# Patient Record
Sex: Male | Born: 1950 | Marital: Single
Health system: Southern US, Community
[De-identification: ages and names within clinical notes are randomized; demographics above are authoritative.]

---

## 2011-04-08 DEATH — deceased

## 2016-01-11 ENCOUNTER — Inpatient Hospital Stay
Admission: AD | Admit: 2016-01-11 | Discharge: 2016-01-28 | Payer: Medicare Other | Source: Ambulatory Visit | Attending: Internal Medicine | Admitting: Internal Medicine

## 2016-01-11 DIAGNOSIS — T85528A Displacement of other gastrointestinal prosthetic devices, implants and grafts, initial encounter: Secondary | ICD-10-CM

## 2016-01-11 DIAGNOSIS — J9690 Respiratory failure, unspecified, unspecified whether with hypoxia or hypercapnia: Secondary | ICD-10-CM

## 2016-01-11 DIAGNOSIS — K650 Generalized (acute) peritonitis: Secondary | ICD-10-CM

## 2016-01-11 DIAGNOSIS — J9600 Acute respiratory failure, unspecified whether with hypoxia or hypercapnia: Secondary | ICD-10-CM

## 2016-01-11 DIAGNOSIS — L0291 Cutaneous abscess, unspecified: Secondary | ICD-10-CM

## 2016-01-11 DIAGNOSIS — R509 Fever, unspecified: Secondary | ICD-10-CM

## 2016-01-11 DIAGNOSIS — K668 Other specified disorders of peritoneum: Secondary | ICD-10-CM

## 2016-01-11 LAB — BLOOD GAS, ARTERIAL
Acid-Base Excess: 1.6 mmol/L (ref 0.0–2.0)
BICARBONATE: 24.7 meq/L — AB (ref 20.0–24.0)
FIO2: 0.3
LHR: 16 {breaths}/min
MECHVT: 500 mL
O2 Saturation: 92.6 %
PATIENT TEMPERATURE: 98.6
PEEP: 5 cmH2O
PO2 ART: 63.4 mmHg — AB (ref 80.0–100.0)
TCO2: 25.7 mmol/L (ref 0–100)
pCO2 arterial: 32.7 mmHg — ABNORMAL LOW (ref 35.0–45.0)
pH, Arterial: 7.491 — ABNORMAL HIGH (ref 7.350–7.450)

## 2016-01-11 LAB — VANCOMYCIN, TROUGH

## 2016-01-12 ENCOUNTER — Other Ambulatory Visit (HOSPITAL_COMMUNITY): Payer: Medicare Other

## 2016-01-12 ENCOUNTER — Encounter: Payer: Self-pay | Admitting: General Surgery

## 2016-01-12 LAB — COMPREHENSIVE METABOLIC PANEL
ALBUMIN: 1.8 g/dL — AB (ref 3.5–5.0)
ALK PHOS: 91 U/L (ref 38–126)
ALT: 20 U/L (ref 17–63)
ALT: 17 U/L (ref 17–63)
ANION GAP: 8 (ref 5–15)
ANION GAP: 6 (ref 5–15)
AST: 24 U/L (ref 15–41)
AST: 21 U/L (ref 15–41)
Albumin: 1.9 g/dL — ABNORMAL LOW (ref 3.5–5.0)
Alkaline Phosphatase: 84 U/L (ref 38–126)
BILIRUBIN TOTAL: 0.6 mg/dL (ref 0.3–1.2)
BUN: 9 mg/dL (ref 6–20)
BUN: 5 mg/dL — ABNORMAL LOW (ref 6–20)
CALCIUM: 8.6 mg/dL — AB (ref 8.9–10.3)
CHLORIDE: 102 mmol/L (ref 101–111)
CO2: 26 mmol/L (ref 22–32)
CO2: 26 mmol/L (ref 22–32)
CREATININE: 0.49 mg/dL — AB (ref 0.61–1.24)
Calcium: 8.5 mg/dL — ABNORMAL LOW (ref 8.9–10.3)
Chloride: 105 mmol/L (ref 101–111)
Creatinine, Ser: 0.52 mg/dL — ABNORMAL LOW (ref 0.61–1.24)
GFR calc Af Amer: >60 mL/min (ref >60)
GFR calc non Af Amer: >60 mL/min (ref >60)
GFR calc non Af Amer: >60 mL/min (ref >60)
GLUCOSE: 142 mg/dL — AB (ref 65–99)
GLUCOSE: 162 mg/dL — AB (ref 65–99)
POTASSIUM: 3.7 mmol/L (ref 3.5–5.1)
Potassium: 3.8 mmol/L (ref 3.5–5.1)
SODIUM: 136 mmol/L (ref 135–145)
Sodium: 137 mmol/L (ref 135–145)
TOTAL PROTEIN: 6.9 g/dL (ref 6.5–8.1)
Total Bilirubin: 0.6 mg/dL (ref 0.3–1.2)
Total Protein: 6.7 g/dL (ref 6.5–8.1)

## 2016-01-12 LAB — CBC
HEMATOCRIT: 32 % — AB (ref 39.0–52.0)
HEMOGLOBIN: 9.9 g/dL — AB (ref 13.0–17.0)
MCH: 27.7 pg (ref 26.0–34.0)
MCHC: 30.9 g/dL (ref 30.0–36.0)
MCV: 89.6 fL (ref 78.0–100.0)
Platelets: 442 10*3/uL — ABNORMAL HIGH (ref 150–400)
RBC: 3.57 MIL/uL — ABNORMAL LOW (ref 4.22–5.81)
RDW: 18.3 % — ABNORMAL HIGH (ref 11.5–15.5)
WBC: 8.7 10*3/uL (ref 4.0–10.5)

## 2016-01-12 LAB — TRIGLYCERIDES: TRIGLYCERIDES: 125 mg/dL (ref <150)

## 2016-01-12 LAB — PHOSPHORUS: PHOSPHORUS: 3.7 mg/dL (ref 2.5–4.6)

## 2016-01-12 LAB — MAGNESIUM: Magnesium: 1.7 mg/dL (ref 1.7–2.4)

## 2016-01-12 LAB — PROTIME-INR
INR: 1.58 — ABNORMAL HIGH (ref 0.00–1.49)
PROTHROMBIN TIME: 18.9 s — AB (ref 11.6–15.2)

## 2016-01-12 NOTE — Consult Note (Signed)
Chief Complaint: j-tube fell out  Referring Physician:Dr. Merton Border  Supervising Physician: Daryll Brod  Patient Status: In-pt   HPI: Stephen Preston is an 65 y.o. male from an outside hospital who apparently had a GI bleed with a partial gastrectomy, colon resection, etc.  I have no records from the previous hospital to determine his exact surgical procedures or his medical history.  No current H&P is in his chart here.  This is from the nurse report.  Apparently he has upper and lower extremity DVTs, IVC filter, and is now on Lovenox 14m/KG and coumadin.  His INR today is 1.58.  Apparently during transport, the patient's j-tube was pulled out by accident.  This happened yesterday.  We have been asked to see him today to evaluate for replacement.  Past Medical History:  See [facility] Chart  Past Surgical History:  See [facility] Chart  Family History:  See [facility] chart  Social History: unable to obtain as he is on the ventilator and unable to communicate   Allergies:NKDA  Medications: Medication list in [facility] chart reviewed  ROS: unable to obtain as he is on the ventilatory and unable to communicate  Mallampati Score: MD Evaluation Airway: Other (comments) Airway comments: trach in place on vent Heart: WNL Abdomen: Other (comments) Abdomen comments: JP drain in place, colostomy/ileostomy bag in RLQ Chest/ Lungs: WNL ASA  Classification: 4  Physical Exam: General: chronically ill-appearing white male who is laying in bed on vent HEENT: head is normocephalic, atraumatic.  Sclera are noninjected.  PERRL.  Ears and nose without any masses or lesions.  Tracheostomy in place and connected to the vent Heart:irregular and tachy Normal s1,s2. No obvious murmurs, gallops, or rubs noted.  Palpable radial pulses bilaterally Lungs: coarse breath sounds bilaterally.  On vent with otherwise good air movement and no distress Abd: soft, ND, +BS, no masses, hernias, or organomegaly.  Old  J-tube site noted and already healed.  Surgical JP drain in place just left lateral of midline wound is covered.  Small open portion of his wound as the inferior aspect, but pink.  RLQ colostomy/ileostomy (unsure which) with liquid output. Psych: arouses to touch and name.  Unable to further assess   Labs: Results for orders placed or performed during the hospital encounter of 01/11/16 (from the past 48 hour(s))  Vancomycin, trough     Status: Abnormal   Collection Time: 01/11/16  6:34 PM  Result Value Ref Range   Vancomycin Tr <4 (L) 10.0 - 20.0 ug/mL    Comment: RESULTS CONFIRMED BY MANUAL DILUTION  Blood gas, arterial     Status: Abnormal   Collection Time: 01/11/16  6:40 PM  Result Value Ref Range   FIO2 0.30    Delivery systems VENTILATOR    Mode ASSIST CONTROL    VT 500.0 mL   LHR 16.0 resp/min   Peep/cpap 5.0 cm H20   pH, Arterial 7.491 (H) 7.350 - 7.450   pCO2 arterial 32.7 (L) 35.0 - 45.0 mmHg   pO2, Arterial 63.4 (L) 80.0 - 100.0 mmHg   Bicarbonate 24.7 (H) 20.0 - 24.0 mEq/L   TCO2 25.7 0 - 100 mmol/L   Acid-Base Excess 1.6 0.0 - 2.0 mmol/L   O2 Saturation 92.6 %   Patient temperature 98.6    Collection site LEFT BRACHIAL    Drawn by COLLECTED BY RT    Sample type ARTERIAL DRAW   Protime-INR     Status: Abnormal   Collection Time: 01/12/16  6:30 AM  Result Value Ref Range   Prothrombin Time 18.9 (H) 11.6 - 15.2 seconds   INR 1.58 (H) 0.00 - 1.49  Comprehensive metabolic panel     Status: Abnormal   Collection Time: 01/12/16  6:30 AM  Result Value Ref Range   Sodium 136 135 - 145 mmol/L   Potassium 3.7 3.5 - 5.1 mmol/L   Chloride 102 101 - 111 mmol/L   CO2 26 22 - 32 mmol/L   Glucose, Bld 142 (H) 65 - 99 mg/dL   BUN 9 6 - 20 mg/dL   Creatinine, Ser 0.52 (L) 0.61 - 1.24 mg/dL   Calcium 8.5 (L) 8.9 - 10.3 mg/dL   Total Protein 6.7 6.5 - 8.1 g/dL   Albumin 1.8 (L) 3.5 - 5.0 g/dL   AST 24 15 - 41 U/L   ALT 20 17 - 63 U/L   Alkaline Phosphatase 84 38 - 126 U/L     Total Bilirubin 0.6 0.3 - 1.2 mg/dL   GFR calc non Af Amer >60 >60 mL/min   GFR calc Af Amer >60 >60 mL/min    Comment: (NOTE) The eGFR has been calculated using the CKD EPI equation. This calculation has not been validated in all clinical situations. eGFR's persistently <60 mL/min signify possible Chronic Kidney Disease.    Anion gap 8 5 - 15  CBC     Status: Abnormal   Collection Time: 01/12/16  6:30 AM  Result Value Ref Range   WBC 8.7 4.0 - 10.5 K/uL   RBC 3.57 (L) 4.22 - 5.81 MIL/uL   Hemoglobin 9.9 (L) 13.0 - 17.0 g/dL   HCT 32.0 (L) 39.0 - 52.0 %   MCV 89.6 78.0 - 100.0 fL   MCH 27.7 26.0 - 34.0 pg   MCHC 30.9 30.0 - 36.0 g/dL   RDW 18.3 (H) 11.5 - 15.5 %   Platelets 442 (H) 150 - 400 K/uL    Imaging: Dg Chest Port 1 View  01/12/2016  CLINICAL DATA:  Respiratory failure ; no additional clinical history available. EXAM: PORTABLE CHEST 1 VIEW COMPARISON:  None in PACs FINDINGS: The left lung is well-expanded. The interstitial markings are mildly prominent especially at the lung base. On the right there is increased density projecting over the mid and lower thirds of the lung with obscuration of the hemidiaphragm. The right heart border remains visible. There is a pigtail catheter in place projecting over the right upper abdomen likely in a subdiaphragmatic location. The heart is normal in size. The pulmonary vascularity is not clearly engorged. A tracheostomy appliance is in place with the tip at the level of the clavicular heads. There is a left whose tip projects over the junction of the proximal and midportions of the SVC. IMPRESSION: 1. Probable right pleural effusion layering posteriorly. Mild interstitial prominence in the left lung compatible with mild interstitial edema. 2. Normal appearance of the heart and pulmonary vascularity. 3. Right-sided pigtail catheter likely lies in a subdiaphragmatic location given the history of free intraperitoneal air. Electronically Signed    By: David  Martinique M.D.   On: 01/12/2016 08:10   Dg Abd Portable 1v  01/12/2016  CLINICAL DATA:  Free peritoneal air, surgical drains ; no further clinical history available. EXAM: PORTABLE ABDOMEN - 1 VIEW COMPARISON:  Chest x-ray of today's date FINDINGS: There is a moderate amount of gas within normal caliber small bowel loops in the mid abdomen. No significant colonic gas is observed. There is an ostomy appliance  on the right. There are 2 pigtail catheters on the right 1 projects over the upper quadrant laterally and the second projects over the mid to lower abdomen on the right. A larger caliber drainage tube tip lies in the epigastrium. There is an inferior vena caval filter present there are surgical clips in the gallbladder fossa and a coil like device is also present in the right mid abdomen. No acute bony abnormality is observed. IMPRESSION: No significant free extraluminal gas collections are observed. The small bowel gas pattern suggests an ileus without CT dilation. There are drainage catheters in place. Electronically Signed   By: David  Martinique M.D.   On: 01/12/2016 08:14    Assessment/Plan 1. Multiple abdominal surgeries with j-tube for nutritional access, pulled out accidentally -we can plan to replace this j-tube, but it is unable to be done at the bedside as this site has healed over.   -he is currently on therapeutic lovenox and transitioning to coumadin.  INR is 1.58.  Hold coumadin and lovenox for possible IR procedure tomorrow if we are able. -follow INR and other labs -remain NPO. -obtain consent from sister for this procedure.   Thank you for this interesting consult.  I greatly enjoyed meeting Stephen Preston and look forward to participating in their care.  A copy of this report was sent to the requesting provider on this date.  Electronically Signed: Henreitta Cea 01/12/2016, 1:58 PM   I spent a total of 40 Minutes  in face to face in clinical consultation, greater than  50% of which was counseling/coordinating care for VDRF and dysphagia, needs j-tube replaced

## 2016-01-13 LAB — BASIC METABOLIC PANEL
Anion gap: 6 (ref 5–15)
BUN: 6 mg/dL (ref 6–20)
CALCIUM: 8.5 mg/dL — AB (ref 8.9–10.3)
CO2: 28 mmol/L (ref 22–32)
CREATININE: 0.52 mg/dL — AB (ref 0.61–1.24)
Chloride: 103 mmol/L (ref 101–111)
GFR calc non Af Amer: >60 mL/min (ref >60)
Glucose, Bld: 167 mg/dL — ABNORMAL HIGH (ref 65–99)
Potassium: 3.4 mmol/L — ABNORMAL LOW (ref 3.5–5.1)
Sodium: 137 mmol/L (ref 135–145)

## 2016-01-13 LAB — PROTIME-INR
INR: 1.42 (ref 0.00–1.49)
PROTHROMBIN TIME: 17.4 s — AB (ref 11.6–15.2)

## 2016-01-13 LAB — VANCOMYCIN, TROUGH: Vancomycin Tr: 16 ug/mL (ref 10.0–20.0)

## 2016-01-13 LAB — CBC
HEMATOCRIT: 33.1 % — AB (ref 39.0–52.0)
Hemoglobin: 10.1 g/dL — ABNORMAL LOW (ref 13.0–17.0)
MCH: 27.5 pg (ref 26.0–34.0)
MCHC: 30.5 g/dL (ref 30.0–36.0)
MCV: 90.2 fL (ref 78.0–100.0)
Platelets: 493 10*3/uL — ABNORMAL HIGH (ref 150–400)
RBC: 3.67 MIL/uL — ABNORMAL LOW (ref 4.22–5.81)
RDW: 18.2 % — AB (ref 11.5–15.5)
WBC: 7.9 10*3/uL (ref 4.0–10.5)

## 2016-01-14 ENCOUNTER — Other Ambulatory Visit (HOSPITAL_COMMUNITY): Payer: Medicare Other

## 2016-01-14 LAB — BASIC METABOLIC PANEL
Anion gap: 6 (ref 5–15)
BUN: 6 mg/dL (ref 6–20)
CALCIUM: 8.5 mg/dL — AB (ref 8.9–10.3)
CO2: 29 mmol/L (ref 22–32)
CREATININE: 0.56 mg/dL — AB (ref 0.61–1.24)
Chloride: 105 mmol/L (ref 101–111)
GFR calc non Af Amer: >60 mL/min (ref >60)
GLUCOSE: 165 mg/dL — AB (ref 65–99)
Potassium: 3.6 mmol/L (ref 3.5–5.1)
Sodium: 140 mmol/L (ref 135–145)

## 2016-01-14 MED ORDER — IOPAMIDOL (ISOVUE-300) INJECTION 61%
INTRAVENOUS | Status: AC
  Administered 2016-01-14: 15 mL
  Filled 2016-01-14: qty 50

## 2016-01-14 MED ORDER — LIDOCAINE VISCOUS 2 % MT SOLN
OROMUCOSAL | Status: AC
  Administered 2016-01-14: 10 mL
  Filled 2016-01-14: qty 15

## 2016-01-14 NOTE — Procedures (Signed)
Successful fluoro [id number] j tube replacement No comp Ready for use Full report in PACS

## 2016-01-15 LAB — BASIC METABOLIC PANEL
Anion gap: 8 (ref 5–15)
BUN: 6 mg/dL (ref 6–20)
CHLORIDE: 104 mmol/L (ref 101–111)
CO2: 28 mmol/L (ref 22–32)
CREATININE: 0.46 mg/dL — AB (ref 0.61–1.24)
Calcium: 8.7 mg/dL — ABNORMAL LOW (ref 8.9–10.3)
Glucose, Bld: 162 mg/dL — ABNORMAL HIGH (ref 65–99)
Potassium: 3 mmol/L — ABNORMAL LOW (ref 3.5–5.1)
SODIUM: 140 mmol/L (ref 135–145)

## 2016-01-15 LAB — VANCOMYCIN, TROUGH: VANCOMYCIN TR: 17 ug/mL (ref 10.0–20.0)

## 2016-01-17 LAB — COMPREHENSIVE METABOLIC PANEL
ALBUMIN: 1.8 g/dL — AB (ref 3.5–5.0)
ALT: 11 U/L — AB (ref 17–63)
AST: 22 U/L (ref 15–41)
Alkaline Phosphatase: 82 U/L (ref 38–126)
Anion gap: 7 (ref 5–15)
BUN: 8 mg/dL (ref 6–20)
CHLORIDE: 107 mmol/L (ref 101–111)
CO2: 25 mmol/L (ref 22–32)
CREATININE: 0.45 mg/dL — AB (ref 0.61–1.24)
Calcium: 8.4 mg/dL — ABNORMAL LOW (ref 8.9–10.3)
GFR calc non Af Amer: >60 mL/min (ref >60)
Glucose, Bld: 171 mg/dL — ABNORMAL HIGH (ref 65–99)
Potassium: 3.2 mmol/L — ABNORMAL LOW (ref 3.5–5.1)
SODIUM: 139 mmol/L (ref 135–145)
Total Bilirubin: 0.4 mg/dL (ref 0.3–1.2)
Total Protein: 6.3 g/dL — ABNORMAL LOW (ref 6.5–8.1)

## 2016-01-17 LAB — MAGNESIUM: Magnesium: 1.6 mg/dL — ABNORMAL LOW (ref 1.7–2.4)

## 2016-01-17 LAB — PHOSPHORUS: PHOSPHORUS: 2.4 mg/dL — AB (ref 2.5–4.6)

## 2016-01-17 LAB — VANCOMYCIN, TROUGH: Vancomycin Tr: 22 ug/mL — ABNORMAL HIGH (ref 10.0–20.0)

## 2016-01-17 LAB — TRIGLYCERIDES: Triglycerides: 176 mg/dL — ABNORMAL HIGH (ref <150)

## 2016-01-18 LAB — PROTIME-INR
INR: 1.4 (ref 0.00–1.49)
Prothrombin Time: 17.3 seconds — ABNORMAL HIGH (ref 11.6–15.2)

## 2016-01-18 LAB — VANCOMYCIN, TROUGH: Vancomycin Tr: 14 ug/mL (ref 10.0–20.0)

## 2016-01-19 LAB — PROTIME-INR
INR: 1.47 (ref 0.00–1.49)
Prothrombin Time: 17.9 seconds — ABNORMAL HIGH (ref 11.6–15.2)

## 2016-01-19 LAB — BASIC METABOLIC PANEL
ANION GAP: 5 (ref 5–15)
BUN: 14 mg/dL (ref 6–20)
CO2: 27 mmol/L (ref 22–32)
Calcium: 8.5 mg/dL — ABNORMAL LOW (ref 8.9–10.3)
Chloride: 107 mmol/L (ref 101–111)
Creatinine, Ser: 0.43 mg/dL — ABNORMAL LOW (ref 0.61–1.24)
GFR calc Af Amer: >60 mL/min (ref >60)
GLUCOSE: 194 mg/dL — AB (ref 65–99)
POTASSIUM: 3.3 mmol/L — AB (ref 3.5–5.1)
Sodium: 139 mmol/L (ref 135–145)

## 2016-01-20 ENCOUNTER — Other Ambulatory Visit (HOSPITAL_COMMUNITY): Payer: Medicare Other

## 2016-01-20 LAB — VANCOMYCIN, TROUGH: VANCOMYCIN TR: 23 ug/mL — AB (ref 10.0–20.0)

## 2016-01-20 LAB — PROTIME-INR
INR: 1.49 (ref 0.00–1.49)
Prothrombin Time: 18.1 seconds — ABNORMAL HIGH (ref 11.6–15.2)

## 2016-01-20 LAB — POTASSIUM: POTASSIUM: 3.6 mmol/L (ref 3.5–5.1)

## 2016-01-21 LAB — BASIC METABOLIC PANEL
Anion gap: 5 (ref 5–15)
BUN: 16 mg/dL (ref 6–20)
CHLORIDE: 107 mmol/L (ref 101–111)
CO2: 27 mmol/L (ref 22–32)
CREATININE: 0.48 mg/dL — AB (ref 0.61–1.24)
Calcium: 8.5 mg/dL — ABNORMAL LOW (ref 8.9–10.3)
GFR calc Af Amer: >60 mL/min (ref >60)
GFR calc non Af Amer: >60 mL/min (ref >60)
Glucose, Bld: 186 mg/dL — ABNORMAL HIGH (ref 65–99)
Potassium: 3.4 mmol/L — ABNORMAL LOW (ref 3.5–5.1)
SODIUM: 139 mmol/L (ref 135–145)

## 2016-01-21 LAB — VANCOMYCIN, TROUGH: VANCOMYCIN TR: 7 ug/mL — AB (ref 10.0–20.0)

## 2016-01-21 LAB — CBC
HEMATOCRIT: 29.5 % — AB (ref 39.0–52.0)
HEMOGLOBIN: 9.1 g/dL — AB (ref 13.0–17.0)
MCH: 28.2 pg (ref 26.0–34.0)
MCHC: 30.8 g/dL (ref 30.0–36.0)
MCV: 91.3 fL (ref 78.0–100.0)
Platelets: 352 10*3/uL (ref 150–400)
RBC: 3.23 MIL/uL — ABNORMAL LOW (ref 4.22–5.81)
RDW: 18.3 % — ABNORMAL HIGH (ref 11.5–15.5)
WBC: 7.5 10*3/uL (ref 4.0–10.5)

## 2016-01-21 LAB — PROTIME-INR
INR: 1.36 (ref 0.00–1.49)
Prothrombin Time: 16.8 seconds — ABNORMAL HIGH (ref 11.6–15.2)

## 2016-01-22 LAB — PROTIME-INR
INR: 1.5 — AB (ref 0.00–1.49)
PROTHROMBIN TIME: 18.2 s — AB (ref 11.6–15.2)

## 2016-01-23 LAB — PROTIME-INR
INR: 1.84 — AB (ref 0.00–1.49)
Prothrombin Time: 21.2 seconds — ABNORMAL HIGH (ref 11.6–15.2)

## 2016-01-24 LAB — COMPREHENSIVE METABOLIC PANEL
ALT: 12 U/L — ABNORMAL LOW (ref 17–63)
ANION GAP: 6 (ref 5–15)
AST: 18 U/L (ref 15–41)
Albumin: 1.8 g/dL — ABNORMAL LOW (ref 3.5–5.0)
Alkaline Phosphatase: 67 U/L (ref 38–126)
BUN: 19 mg/dL (ref 6–20)
CHLORIDE: 105 mmol/L (ref 101–111)
CO2: 28 mmol/L (ref 22–32)
Calcium: 8.5 mg/dL — ABNORMAL LOW (ref 8.9–10.3)
Creatinine, Ser: 0.58 mg/dL — ABNORMAL LOW (ref 0.61–1.24)
GFR calc Af Amer: >60 mL/min (ref >60)
Glucose, Bld: 178 mg/dL — ABNORMAL HIGH (ref 65–99)
POTASSIUM: 3.1 mmol/L — AB (ref 3.5–5.1)
Sodium: 139 mmol/L (ref 135–145)
Total Bilirubin: 0.3 mg/dL (ref 0.3–1.2)
Total Protein: 6.4 g/dL — ABNORMAL LOW (ref 6.5–8.1)

## 2016-01-24 LAB — PROTIME-INR
INR: 3.38 — AB (ref 0.00–1.49)
PROTHROMBIN TIME: 33.5 s — AB (ref 11.6–15.2)

## 2016-01-24 LAB — PHOSPHORUS: Phosphorus: 3.2 mg/dL (ref 2.5–4.6)

## 2016-01-24 LAB — TRIGLYCERIDES: TRIGLYCERIDES: 92 mg/dL (ref <150)

## 2016-01-24 LAB — MAGNESIUM: MAGNESIUM: 1.8 mg/dL (ref 1.7–2.4)

## 2016-01-25 LAB — PROTIME-INR
INR: 4.94 — AB (ref 0.00–1.49)
PROTHROMBIN TIME: 44.5 s — AB (ref 11.6–15.2)

## 2016-01-26 ENCOUNTER — Other Ambulatory Visit (HOSPITAL_COMMUNITY): Payer: Medicare Other

## 2016-01-26 LAB — BASIC METABOLIC PANEL
ANION GAP: 9 (ref 5–15)
BUN: 20 mg/dL (ref 6–20)
CO2: 29 mmol/L (ref 22–32)
Calcium: 9.1 mg/dL (ref 8.9–10.3)
Chloride: 108 mmol/L (ref 101–111)
Creatinine, Ser: 0.58 mg/dL — ABNORMAL LOW (ref 0.61–1.24)
GFR calc non Af Amer: >60 mL/min (ref >60)
GLUCOSE: 240 mg/dL — AB (ref 65–99)
POTASSIUM: 3.1 mmol/L — AB (ref 3.5–5.1)
Sodium: 146 mmol/L — ABNORMAL HIGH (ref 135–145)

## 2016-01-26 LAB — URINE MICROSCOPIC-ADD ON: BACTERIA UA: NONE SEEN

## 2016-01-26 LAB — CBC
HEMATOCRIT: 29.8 % — AB (ref 39.0–52.0)
Hemoglobin: 9 g/dL — ABNORMAL LOW (ref 13.0–17.0)
MCH: 27.8 pg (ref 26.0–34.0)
MCHC: 30.2 g/dL (ref 30.0–36.0)
MCV: 92 fL (ref 78.0–100.0)
PLATELETS: 320 10*3/uL (ref 150–400)
RBC: 3.24 MIL/uL — AB (ref 4.22–5.81)
RDW: 17.7 % — ABNORMAL HIGH (ref 11.5–15.5)
WBC: 7 10*3/uL (ref 4.0–10.5)

## 2016-01-26 LAB — URINALYSIS, ROUTINE W REFLEX MICROSCOPIC
Bilirubin Urine: NEGATIVE
GLUCOSE, UA: NEGATIVE mg/dL
Ketones, ur: NEGATIVE mg/dL
LEUKOCYTES UA: NEGATIVE
Nitrite: NEGATIVE
PH: 7 (ref 5.0–8.0)
Protein, ur: 100 mg/dL — AB
SPECIFIC GRAVITY, URINE: 1.025 (ref 1.005–1.030)

## 2016-01-26 LAB — PROCALCITONIN

## 2016-01-26 LAB — PROTIME-INR
INR: 4.15 — AB (ref 0.00–1.49)
INR: 4.25 — AB (ref 0.00–1.49)
PROTHROMBIN TIME: 39.8 s — AB (ref 11.6–15.2)
Prothrombin Time: 39.1 seconds — ABNORMAL HIGH (ref 11.6–15.2)

## 2016-01-26 LAB — C-REACTIVE PROTEIN: CRP: 10.4 mg/dL — ABNORMAL HIGH (ref <1.0)

## 2016-01-26 LAB — MAGNESIUM: Magnesium: 1.8 mg/dL (ref 1.7–2.4)

## 2016-01-26 LAB — SEDIMENTATION RATE: Sed Rate: >140 mm/hr — ABNORMAL HIGH (ref 0–16)

## 2016-01-27 ENCOUNTER — Other Ambulatory Visit (HOSPITAL_COMMUNITY): Payer: Medicare Other

## 2016-01-27 ENCOUNTER — Encounter: Payer: Self-pay | Admitting: Radiology

## 2016-01-27 LAB — CBC
HEMATOCRIT: 29.5 % — AB (ref 39.0–52.0)
HEMOGLOBIN: 8.9 g/dL — AB (ref 13.0–17.0)
MCH: 27.9 pg (ref 26.0–34.0)
MCHC: 30.2 g/dL (ref 30.0–36.0)
MCV: 92.5 fL (ref 78.0–100.0)
Platelets: 332 10*3/uL (ref 150–400)
RBC: 3.19 MIL/uL — ABNORMAL LOW (ref 4.22–5.81)
RDW: 17.5 % — ABNORMAL HIGH (ref 11.5–15.5)
WBC: 6.6 10*3/uL (ref 4.0–10.5)

## 2016-01-27 LAB — URINE CULTURE: Culture: NO GROWTH

## 2016-01-27 LAB — BASIC METABOLIC PANEL
ANION GAP: 5 (ref 5–15)
BUN: 20 mg/dL (ref 6–20)
CALCIUM: 9 mg/dL (ref 8.9–10.3)
CO2: 31 mmol/L (ref 22–32)
Chloride: 111 mmol/L (ref 101–111)
Creatinine, Ser: 0.42 mg/dL — ABNORMAL LOW (ref 0.61–1.24)
GFR calc non Af Amer: >60 mL/min (ref >60)
GLUCOSE: 164 mg/dL — AB (ref 65–99)
Potassium: 3.2 mmol/L — ABNORMAL LOW (ref 3.5–5.1)
Sodium: 147 mmol/L — ABNORMAL HIGH (ref 135–145)

## 2016-01-27 LAB — PROTIME-INR
INR: 2.9 — AB (ref 0.00–1.49)
PROTHROMBIN TIME: 29.8 s — AB (ref 11.6–15.2)

## 2016-01-27 MED ORDER — IOPAMIDOL (ISOVUE-300) INJECTION 61%
100.0000 mL | Freq: Once | INTRAVENOUS | Status: AC | PRN
  Administered 2016-01-27: 100 mL via INTRAVENOUS

## 2016-01-28 ENCOUNTER — Ambulatory Visit (HOSPITAL_COMMUNITY)
Admission: AD | Admit: 2016-01-28 | Discharge: 2016-01-28 | Disposition: A | Discharge status: Home / Self Care | Payer: Medicare Other | Source: Other Acute Inpatient Hospital | Attending: Surgery | Admitting: Surgery

## 2016-01-28 ENCOUNTER — Inpatient Hospital Stay (HOSPITAL_COMMUNITY): Admission: AD | Admit: 2016-01-28 | Payer: Self-pay | Source: Other Acute Inpatient Hospital | Admitting: Surgery

## 2016-01-28 DIAGNOSIS — K631 Perforation of intestine (nontraumatic): Secondary | ICD-10-CM | POA: Diagnosis present

## 2016-01-31 LAB — CULTURE, BLOOD (ROUTINE X 2)
CULTURE: NO GROWTH
Culture: NO GROWTH

## 2017-02-04 DEATH — deceased

## 2017-03-07 DEATH — deceased

## 2018-03-13 IMAGING — DX DG CHEST 1V PORT
1 series · 1 of 1 positions shown · non-contrast
Comparison: 01/12/2016 .

CLINICAL DATA: Respiratory failure.

EXAM:
PORTABLE CHEST 1 VIEW

[chest ap]
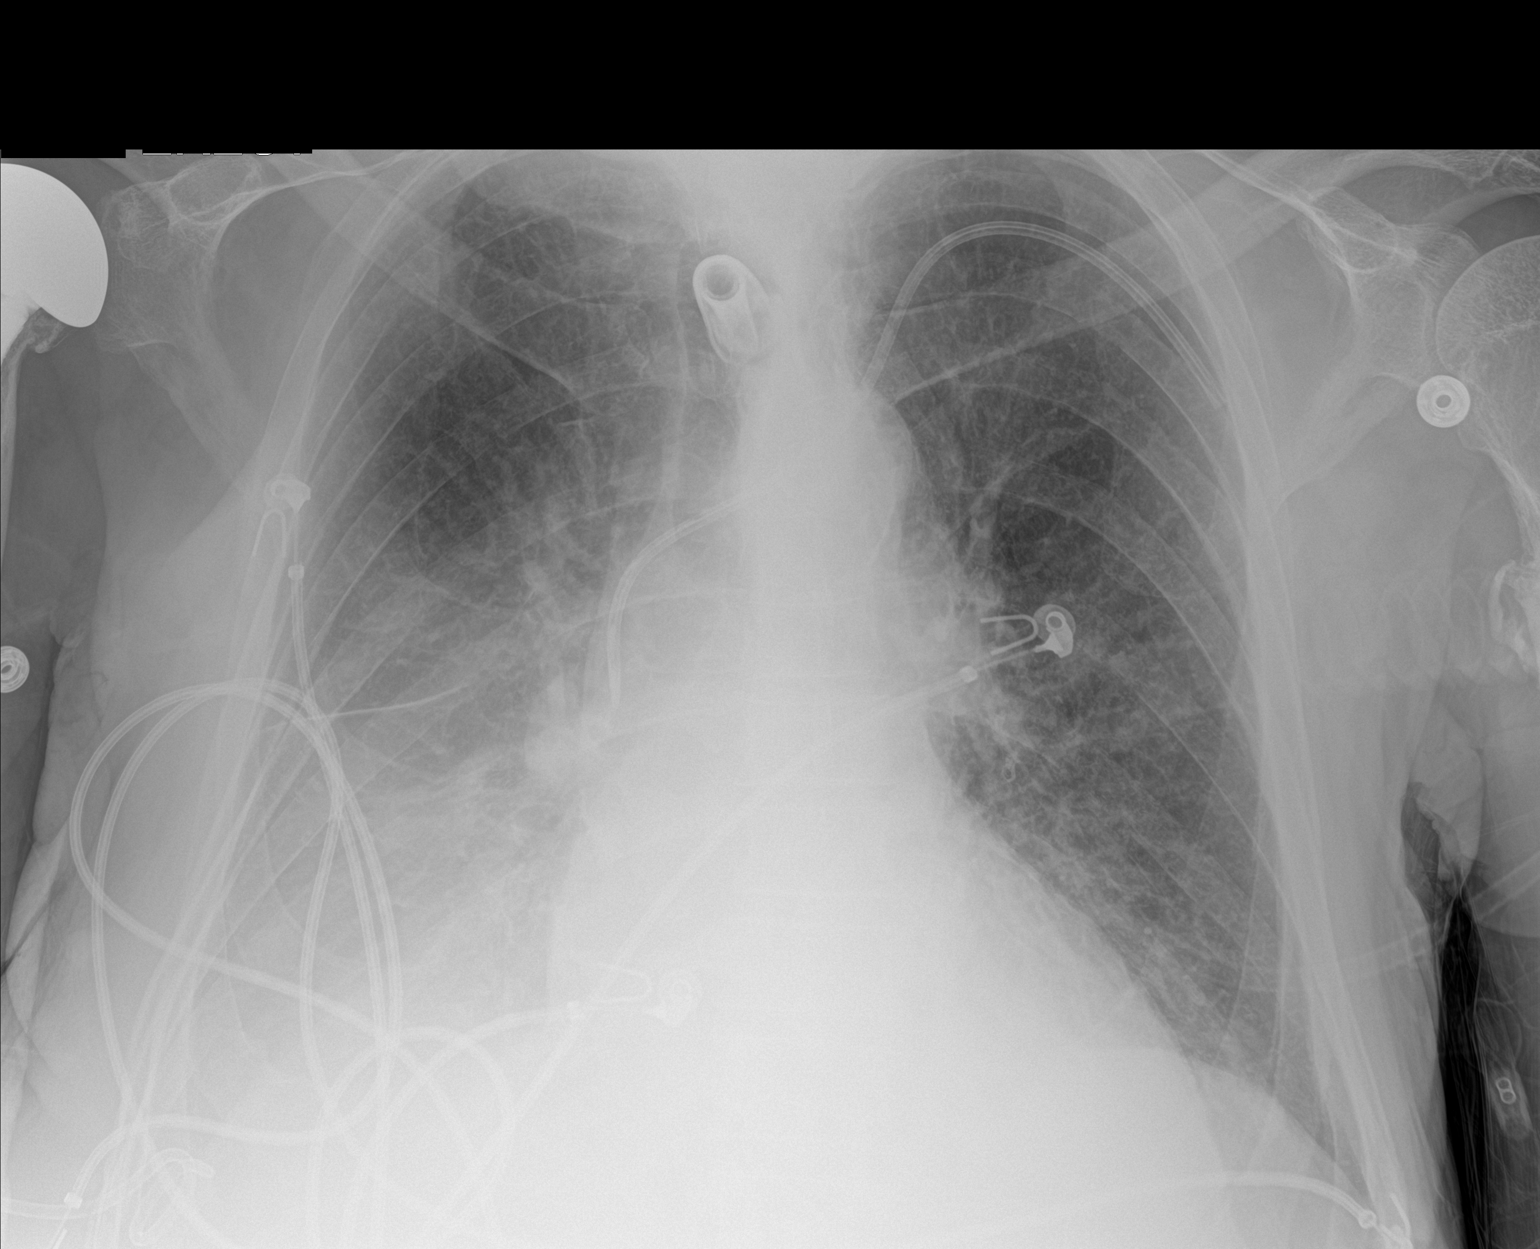

[1 of 1 positions shown; findings below may reference images not displayed]

FINDINGS: Tracheostomy tube and left IJ line stable position. Mediastinum
hilar structures stable. Persistent cardiomegaly with bilateral from
interstitial prominence and right pleural effusion noted consistent
with congestive heart failure. No interim change. No pneumothorax.
Right shoulder replacement .
IMPRESSION: 1.  Lines and tubes in stable position.

2. Persistent cardiomegaly with bilateral from interstitial
prominence and right pleural effusion consistent with persistent
congestive heart failure. No significant interim change.
